# Patient Record
Sex: Female | Born: 2000 | Race: Black or African American | Hispanic: No | Marital: Single | Smoking: Current every day smoker
Health system: Southern US, Community
[De-identification: ages and names within clinical notes are randomized; demographics above are authoritative.]

---
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
## 2019-04-01 IMAGING — US US TRANSVAGINAL NON-OB
1 series · 13 of 25 positions shown · non-contrast
Comparison: None.

CLINICAL DATA: Initial evaluation for acute pelvic pain

EXAM:
TRANSABDOMINAL AND TRANSVAGINAL ULTRASOUND OF PELVIS
DOPPLER ULTRASOUND OF OVARIES
TECHNIQUE: Both transabdominal and transvaginal ultrasound examinations of the
pelvis were performed. Transabdominal technique was performed for
global imaging of the pelvis including uterus, ovaries, adnexal
regions, and pelvic cul-de-sac.
It was necessary to proceed with endovaginal exam following the
transabdominal exam to visualize the pelvic structures. Color and
duplex Doppler ultrasound was utilized to evaluate blood flow to the
ovaries.

[Series 1: us transvaginal non-ob · 0.24mm/px · 13 of 80 slices shown]
[im 1/80]
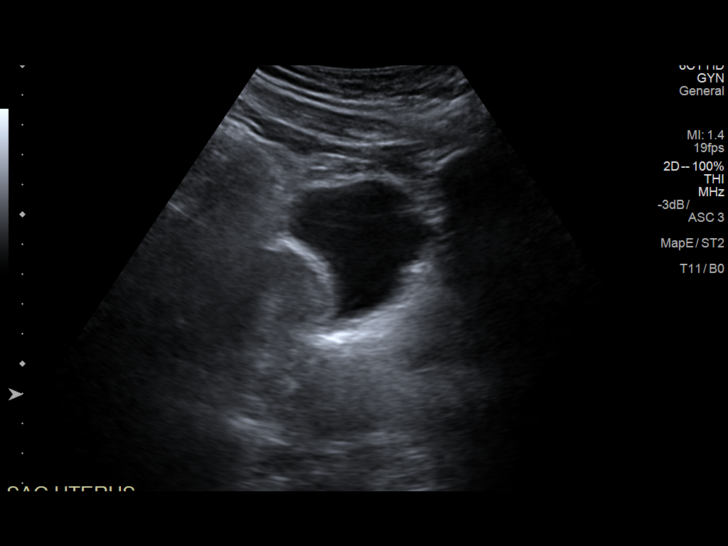
[im 7/80]
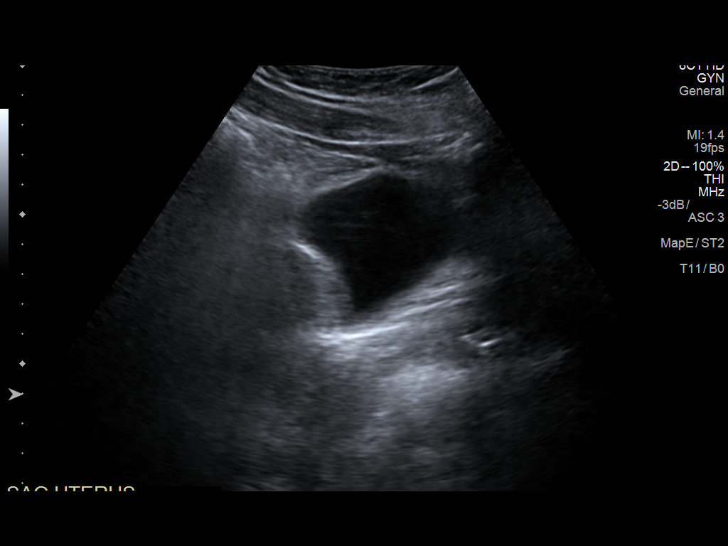
[im 14/80]
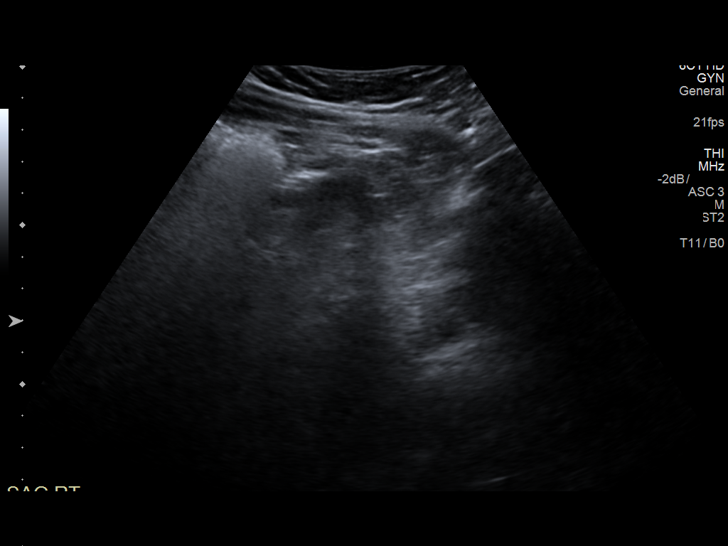
[im 20/80]
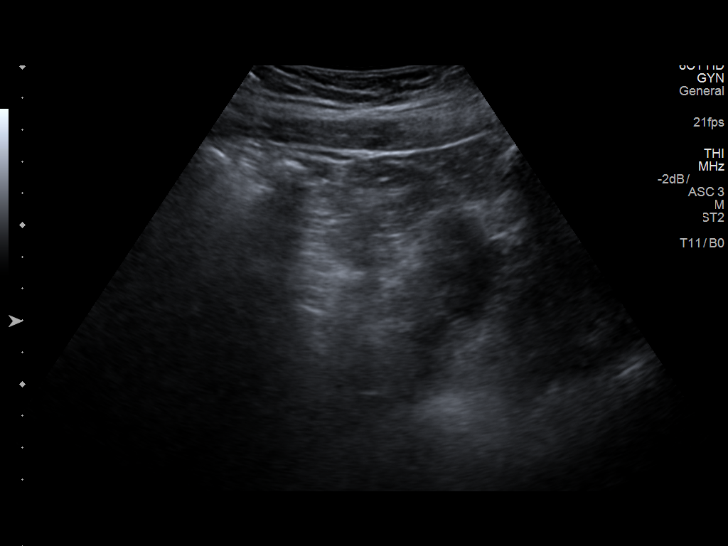
[im 27/80]
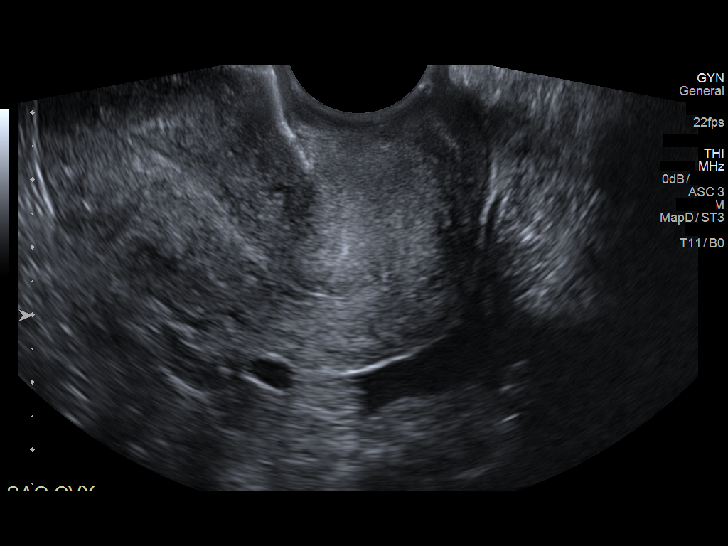
[im 33/80]
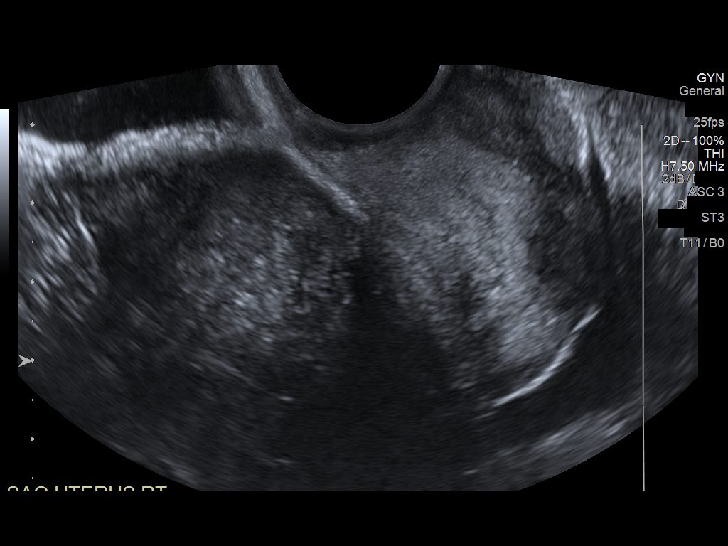
[im 40/80]
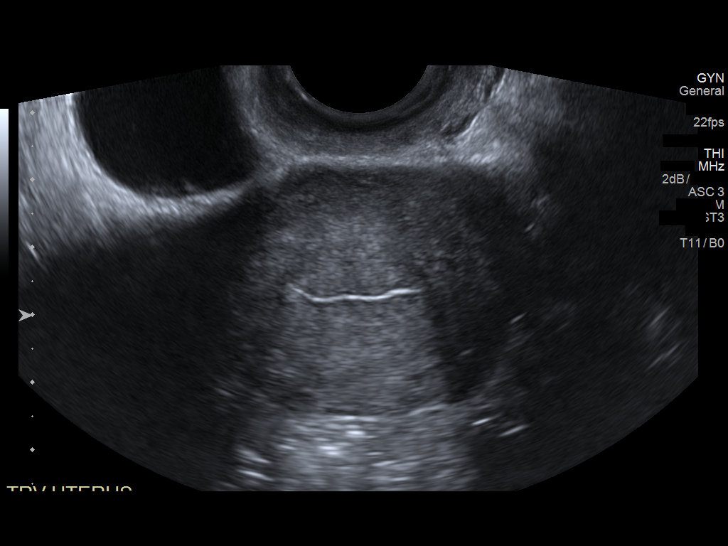
[im 47/80]
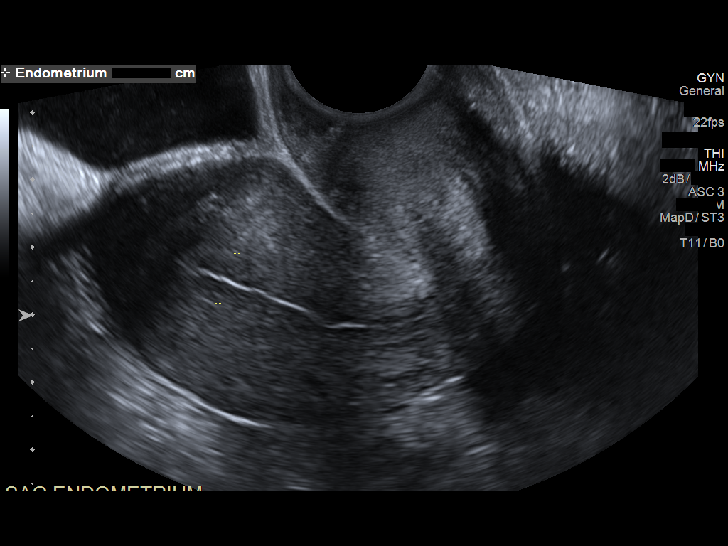
[im 53/80]
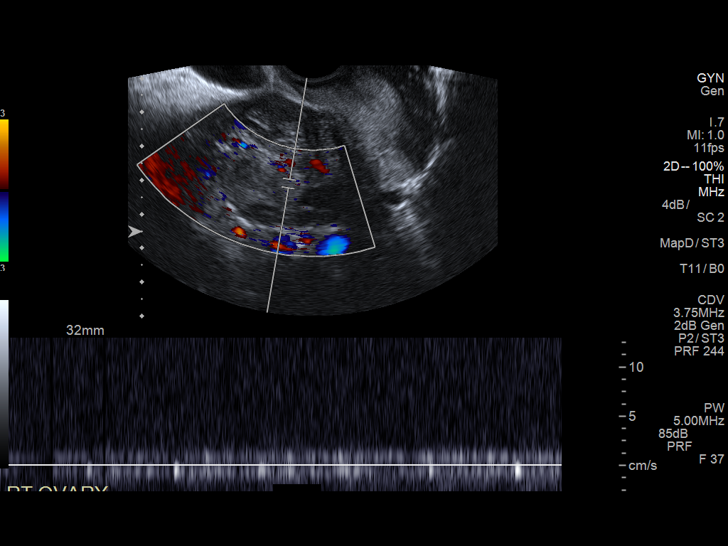
[im 60/80]
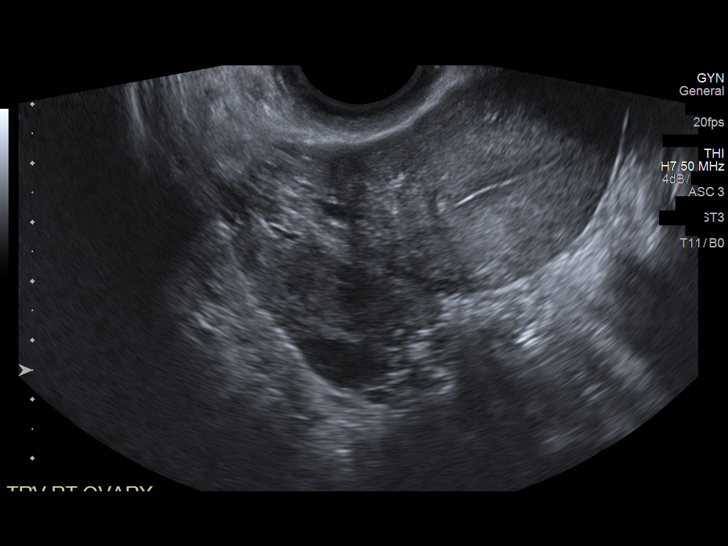
[im 66/80]
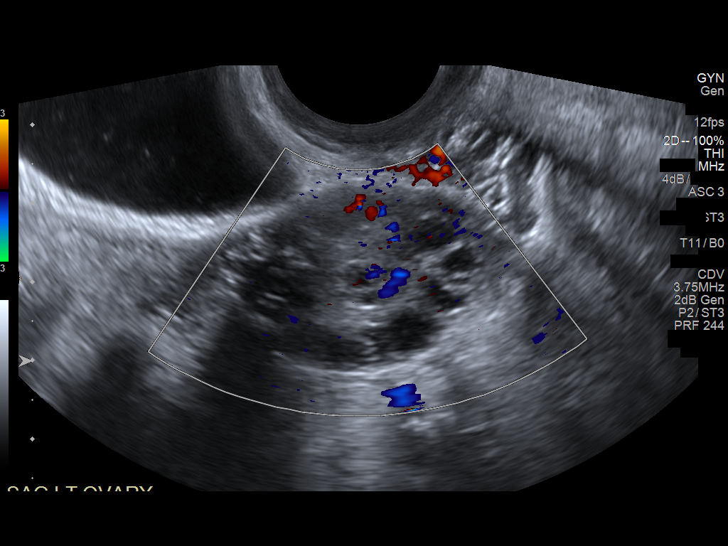
[im 73/80]
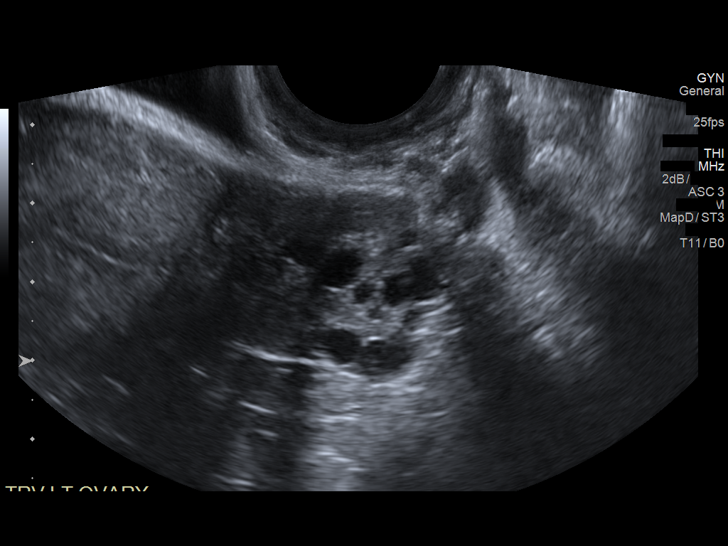
[im 80/80]
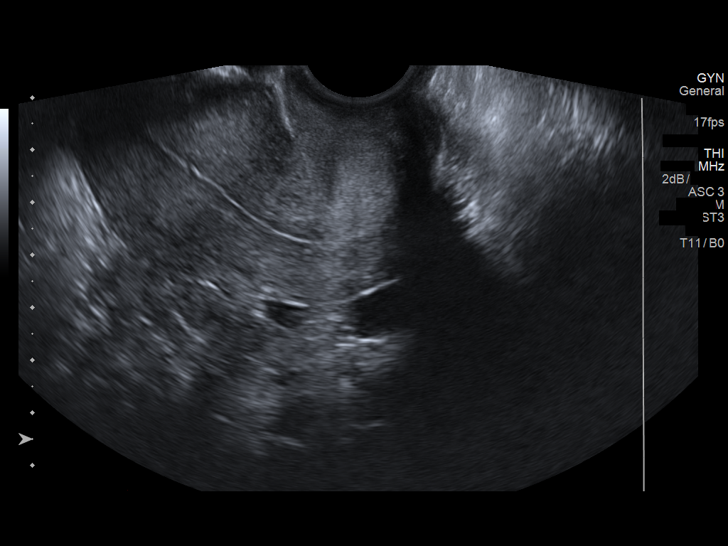

[13 of 25 positions shown; findings below may reference images not displayed]

FINDINGS: Uterus

Measurements: 6.6 x 3.6 x 4.5 cm. No fibroids or other mass
visualized.

Endometrium

Thickness: 7.9 mm.  No focal abnormality visualized.

Right ovary

Measurements: 4.0 x 2.1 x 1.7 cm. Normal appearance/no adnexal mass.

Left ovary

Measurements: 3.7 x 2.3 x 2.1 cm. Normal appearance/no adnexal mass.

Pulsed Doppler evaluation of both ovaries demonstrates normal
low-resistance arterial and venous waveforms.

Other findings

Trace free physiologic fluid present within the pelvis.
IMPRESSION: Normal pelvic ultrasound. No evidence for torsion or other acute
abnormality.
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
